# Patient Record
Sex: Male | Born: 1990 | Race: Black or African American | Hispanic: No | Marital: Single | Smoking: Current every day smoker
Health system: Southern US, Community
[De-identification: ages and names within clinical notes are randomized; demographics above are authoritative.]

---
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
## 2009-11-23 ENCOUNTER — Emergency Department: Payer: Self-pay | Admitting: Emergency Medicine

## 2014-02-08 ENCOUNTER — Emergency Department: Payer: Self-pay | Admitting: Emergency Medicine

## 2014-02-08 LAB — CBC WITH DIFFERENTIAL/PLATELET
BASOS ABS: 0.1 10*3/uL (ref 0.0–0.1)
Basophil %: 1.2 %
EOS ABS: 0.1 10*3/uL (ref 0.0–0.7)
Eosinophil %: 3.3 %
HCT: 41.7 % (ref 40.0–52.0)
HGB: 13.4 g/dL (ref 13.0–18.0)
Lymphocyte #: 1.8 10*3/uL (ref 1.0–3.6)
Lymphocyte %: 41.6 %
MCH: 23 pg — AB (ref 26.0–34.0)
MCHC: 32.2 g/dL (ref 32.0–36.0)
MCV: 71 fL — ABNORMAL LOW (ref 80–100)
MONO ABS: 0.4 x10 3/mm (ref 0.2–1.0)
Monocyte %: 8.9 %
Neutrophil #: 2 10*3/uL (ref 1.4–6.5)
Neutrophil %: 45 %
Platelet: 248 10*3/uL (ref 150–440)
RBC: 5.83 10*6/uL (ref 4.40–5.90)
RDW: 15.5 % — AB (ref 11.5–14.5)
WBC: 4.4 10*3/uL (ref 3.8–10.6)

## 2014-02-08 LAB — COMPREHENSIVE METABOLIC PANEL
ALBUMIN: 3.8 g/dL (ref 3.4–5.0)
ALK PHOS: 131 U/L — AB
ALT: 30 U/L (ref 12–78)
Anion Gap: 4 — ABNORMAL LOW (ref 7–16)
BUN: 17 mg/dL (ref 7–18)
Bilirubin,Total: 0.2 mg/dL (ref 0.2–1.0)
CALCIUM: 8.5 mg/dL (ref 8.5–10.1)
CO2: 27 mmol/L (ref 21–32)
CREATININE: 1.07 mg/dL (ref 0.60–1.30)
Chloride: 106 mmol/L (ref 98–107)
EGFR (African American): >60
Glucose: 101 mg/dL — ABNORMAL HIGH (ref 65–99)
Osmolality: 276 (ref 275–301)
Potassium: 3.8 mmol/L (ref 3.5–5.1)
SGOT(AST): 23 U/L (ref 15–37)
SODIUM: 137 mmol/L (ref 136–145)
Total Protein: 7.7 g/dL (ref 6.4–8.2)

## 2014-02-08 LAB — URINALYSIS, COMPLETE
Bacteria: NONE SEEN
Bilirubin,UR: NEGATIVE
Glucose,UR: NEGATIVE mg/dL (ref 0–75)
Ketone: NEGATIVE
Leukocyte Esterase: NEGATIVE
NITRITE: NEGATIVE
Ph: 7 (ref 4.5–8.0)
Protein: NEGATIVE
SPECIFIC GRAVITY: 1.017 (ref 1.003–1.030)
Squamous Epithelial: NONE SEEN
WBC UR: NONE SEEN /HPF (ref 0–5)

## 2014-02-08 LAB — DRUG SCREEN, URINE

## 2014-02-08 LAB — ETHANOL: Ethanol %: <0.003 % (ref 0.000–0.080)

## 2018-09-14 ENCOUNTER — Encounter (HOSPITAL_COMMUNITY): Payer: Self-pay

## 2018-09-14 ENCOUNTER — Emergency Department (HOSPITAL_COMMUNITY)
Admission: EM | Admit: 2018-09-14 | Discharge: 2018-09-14 | Disposition: A | Discharge status: Home / Self Care | Payer: Medicaid Other | Attending: Emergency Medicine | Admitting: Emergency Medicine

## 2018-09-14 ENCOUNTER — Emergency Department (HOSPITAL_COMMUNITY): Payer: Medicaid Other

## 2018-09-14 DIAGNOSIS — M7918 Myalgia, other site: Secondary | ICD-10-CM

## 2018-09-14 DIAGNOSIS — M791 Myalgia, unspecified site: Secondary | ICD-10-CM | POA: Diagnosis not present

## 2018-09-14 DIAGNOSIS — F17210 Nicotine dependence, cigarettes, uncomplicated: Secondary | ICD-10-CM | POA: Diagnosis not present

## 2018-09-14 MED ORDER — METHOCARBAMOL 500 MG PO TABS: 500.0000 mg | ORAL_TABLET | Freq: Two times a day (BID) | ORAL | 0 refills | Status: AC

## 2018-09-14 NOTE — ED Notes (Signed)
Patient transported to X-ray 
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
## 2018-09-14 NOTE — ED Notes (Signed)
Pt stable, ambulatory, states understanding of discharge instructions 

## 2018-09-14 NOTE — Discharge Instructions (Addendum)
Please read attached information. If you experience any new or worsening signs or symptoms please return to the emergency room for evaluation. Please follow-up with your primary care provider or specialist as discussed. Please use medication prescribed only as directed and discontinue taking if you have any concerning signs or symptoms.   °
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
## 2018-09-28 ENCOUNTER — Emergency Department (HOSPITAL_COMMUNITY)
Admission: EM | Admit: 2018-09-28 | Discharge: 2018-09-28 | Disposition: A | Discharge status: Home / Self Care | Payer: Medicaid Other | Attending: Emergency Medicine | Admitting: Emergency Medicine

## 2018-09-28 ENCOUNTER — Encounter (HOSPITAL_COMMUNITY): Payer: Self-pay | Admitting: Student

## 2018-09-28 ENCOUNTER — Other Ambulatory Visit: Payer: Self-pay

## 2018-09-28 DIAGNOSIS — Y939 Activity, unspecified: Secondary | ICD-10-CM | POA: Diagnosis not present

## 2018-09-28 DIAGNOSIS — F17200 Nicotine dependence, unspecified, uncomplicated: Secondary | ICD-10-CM | POA: Insufficient documentation

## 2018-09-28 DIAGNOSIS — Y998 Other external cause status: Secondary | ICD-10-CM | POA: Diagnosis not present

## 2018-09-28 DIAGNOSIS — M545 Low back pain, unspecified: Secondary | ICD-10-CM

## 2018-09-28 DIAGNOSIS — Y92410 Unspecified street and highway as the place of occurrence of the external cause: Secondary | ICD-10-CM | POA: Diagnosis not present

## 2018-09-28 MED ORDER — NAPROXEN 500 MG PO TABS: 500.0000 mg | ORAL_TABLET | Freq: Two times a day (BID) | ORAL | 0 refills | Status: AC
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
## 2018-09-28 NOTE — Discharge Instructions (Signed)
You were seen in the emergency department for back pain today.  At this time we suspect that your pain is related to a muscle strain/spasm.   I have prescribed you an anti-inflammatory medication and a muscle relaxer.  - Naproxen is a nonsteroidal anti-inflammatory medication that will help with pain and swelling. Be sure to take this medication as prescribed with food, 1 pill every 12 hours,  It should be taken with food, as it can cause stomach upset, and more seriously, stomach bleeding. Do not take other nonsteroidal anti-inflammatory medications with this such as Advil, Motrin, Aleve, Mobic, Goodie Powder, or Motrin.    You may continue to take your muscle relaxer as needed- do not drive or operate heavy machinery when taking this medicine.   You make take Tylenol per over the counter dosing with these medications.   We have prescribed you new medication(s) today. Discuss the medications prescribed today with your pharmacist as they can have adverse effects and interactions with your other medicines including over the counter and prescribed medications. Seek medical evaluation if you start to experience new or abnormal symptoms after taking one of these medicines, seek care immediately if you start to experience difficulty breathing, feeling of your throat closing, facial swelling, or rash as these could be indications of a more serious allergic reaction   The application of heat can help soothe the pain.  Maintaining your daily activities, including walking, is encourged, as it will help you get better faster than just staying in bed.  Your pain should get better over the next 2 weeks.  You will need to follow up with  Your primary healthcare provider in 1-2 weeks for reassessment, if you do not have a primary care provider one is provided in your discharge instructions- you may see the [facility] or call the provided phone number. However return to the ER should you develop ne or  worsening symptoms or any other concerns including but not limited to severe or worsening pain, low back pain with fever, numbness, weakness, loss of bowel or bladder control, or inability to walk or urinate, you should return to the ER immediately.
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
## 2018-09-28 NOTE — ED Notes (Signed)
Pt is ambulatory. But states he has lower back pain with movement.
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
